# Patient Record
Sex: Female | Born: 1985 | Hispanic: Yes | Marital: Single | Smoking: Never smoker
Health system: Southern US, Community
[De-identification: ages and names within clinical notes are randomized; demographics above are authoritative.]

## PROBLEM LIST (undated history)

## (undated) DIAGNOSIS — Z789 Other specified health status: Secondary | ICD-10-CM

## (undated) HISTORY — PX: NO PAST SURGERIES: SHX2092

---

## 2016-06-18 NOTE — L&D Delivery Note (Signed)
Patient is 31 y.o. G1P0 4266w2d admitted for SROM/SOL. Low-risk pregnancy.  Delivery Note Upon arrival patient was complete and pushing. She pushed with good maternal effort to deliver a viable infant. Baby delivered without difficulty, was noted to have good tone and place on maternal abdomen for oral suctioning, drying and stimulation. Delayed cord clamping performed. At 6:41 PM a viable female was delivered via Vaginal, Spontaneous Delivery (Presentation: LOA).  APGAR: 9, 9; weight pending.   Placenta status: Intact .  Cord: 3v with the following complications: None.  Cord pH: N/A  Anesthesia:  None Episiotomy: None Lacerations:  Right Labial Suture Repair: 2.0 Monocryl Est. Blood Loss (mL):  250  Vaginal canal and perineum were inspected and noted to be hemostatic. Pitocin was started and uterus massaged until bleeding slowed.  Fundus firm on exam with massage.  Counts of sharps, instruments, and lap pads were all correct.  Mom to postpartum.  Baby to Couplet care / Skin to Skin.  Caryl AdaJazma Phelps, DO OB Fellow Faculty Practice, [facility] [place] 01/13/2017, 7:17 PM

## 2016-06-29 ENCOUNTER — Inpatient Hospital Stay (HOSPITAL_COMMUNITY)
Admission: AD | Admit: 2016-06-29 | Discharge: 2016-06-29 | Disposition: A | Discharge status: Home / Self Care | Payer: Medicaid Other | Source: Ambulatory Visit | Attending: Obstetrics and Gynecology | Admitting: Obstetrics and Gynecology

## 2016-06-29 ENCOUNTER — Encounter (HOSPITAL_COMMUNITY): Payer: Self-pay | Admitting: *Deleted

## 2016-06-29 DIAGNOSIS — R109 Unspecified abdominal pain: Secondary | ICD-10-CM

## 2016-06-29 DIAGNOSIS — O26891 Other specified pregnancy related conditions, first trimester: Secondary | ICD-10-CM | POA: Insufficient documentation

## 2016-06-29 DIAGNOSIS — Z3A11 11 weeks gestation of pregnancy: Secondary | ICD-10-CM | POA: Diagnosis not present

## 2016-06-29 HISTORY — DX: Other specified health status: Z78.9

## 2016-06-29 LAB — URINALYSIS, ROUTINE W REFLEX MICROSCOPIC
Bilirubin Urine: NEGATIVE
Glucose, UA: NEGATIVE mg/dL
Hgb urine dipstick: NEGATIVE
Ketones, ur: NEGATIVE mg/dL
Nitrite: NEGATIVE
PROTEIN: NEGATIVE mg/dL
Specific Gravity, Urine: 1.005 (ref 1.005–1.030)
pH: 6 (ref 5.0–8.0)

## 2016-06-29 LAB — POCT PREGNANCY, URINE: PREG TEST UR: POSITIVE — AB

## 2016-06-29 NOTE — MAU Note (Signed)
C/o  Lower, intermittent, abdominal pain  That wraps around to her lower back since yesterday around 1800; denies any vaginal bleeding or vaginal discharge;

## 2016-06-29 NOTE — Discharge Instructions (Signed)
Dolor abdominal durante el embarazo  (Abdominal Pain During Pregnancy)  El dolor de vientre (abdominal) es habitual durante el embarazo. Generalmente no se trata de un problema grave. Otras veces puede ser un signo de que algo no anda bien. Siempre comuníquese con su médico si tiene dolor abdominal.  CUIDADOS EN EL HOGAR  Controle el dolor para ver si hay cambios. Las indicaciones que siguen pueden ayudarla a sentirse mejor:  · Notenga sexo (relaciones sexuales) ni se coloque nada dentro de la vagina hasta que se sienta mejor.  · Haga reposo hasta que el dolor se calme.  · Si siente ganas de vomitar (náuseas ) beba líquidos claros. No consuma alimentos sólidos hasta que se sienta mejor.  · Sólo tome los medicamentos que le haya indicado su médico.  · Cumpla con las visitas al médico según las indicaciones.  SOLICITE AYUDA DE INMEDIATO SI:  · Tiene un sangrado, pierde líquido o elimina trozos de tejido por la vagina.  · Siente más dolor o cólicos.  · Comienza a vomitar.  · Siente dolor al orinar u observa sangre en la orina.  · Tiene fiebre.  · No siente que el bebé se mueva mucho.  · Se siente muy débil o cree que va a desmayarse.  · Tiene dificultad para respirar con o sin dolor en el vientre.  · Siente un dolor de cabeza muy intenso y dolor en el vientre.  · Observa que sale un líquido por la vagina y tiene dolor abdominal.  · La materia fecal es líquida (diarrea).  · El dolor en el viente no desaparece, o empeora, luego de hacer reposo.    ASEGÚRESE DE QUE:  · Comprende estas instrucciones.  · Controlará su afección.  · Recibirá ayuda de inmediato si no mejora o si empeora.    Esta información no tiene como fin reemplazar el consejo del médico. Asegúrese de hacerle al médico cualquier pregunta que tenga.  Document Released: 02/14/2011 Document Revised: 09/26/2015 Document Reviewed: 01/01/2013  Elsevier Interactive Patient Education © 2017 Elsevier Inc.

## 2016-06-29 NOTE — MAU Provider Note (Signed)
History     CSN: [id number]  Arrival date and time: 06/29/16 1128   First Provider Initiated Contact with Patient 06/29/16 1200      Chief Complaint  Patient presents with  . Abdominal Pain   HPI Ms. Alyssa Oconnell is a 31 y.o. G1P0 at [redacted]w[redacted]d who presents to MAU today with complaint of abdominal pain since yesterday. She rates her pain at 3/10 now. She has not taken anything for pain. She denies bleeding, discharge, or UTI symptoms today.   OB History    Gravida Para Term Preterm AB Living   1             SAB TAB Ectopic Multiple Live Births                  Past Medical History:  Diagnosis Date  . Medical history non-contributory     Past Surgical History:  Procedure Laterality Date  . NO PAST SURGERIES      No family history on file.  Social History  Substance Use Topics  . Smoking status: Never Smoker  . Smokeless tobacco: Never Used  . Alcohol use No    Allergies: Allergies not on file  No prescriptions prior to admission.    Review of Systems  Constitutional: Negative for fever.  Gastrointestinal: Positive for abdominal pain. Negative for constipation, diarrhea, nausea and vomiting.  Genitourinary: Negative for dysuria, frequency, urgency, vaginal bleeding and vaginal discharge.   Physical Exam   Blood pressure 113/81, pulse 75, temperature 98 F (36.7 C), temperature source Oral, resp. rate 18.  Physical Exam  Nursing note and vitals reviewed. Constitutional: She is oriented to person, place, and time. She appears well-developed and well-nourished. No distress.  HENT:  Head: Normocephalic and atraumatic.  Cardiovascular: Normal rate.   Respiratory: Effort normal.  GI: Soft. She exhibits no distension and no mass. There is no tenderness. There is no rebound and no guarding.  Neurological: She is alert and oriented to person, place, and time.  Skin: Skin is warm and dry. No erythema.  Psychiatric: She has a normal mood and affect.   Dilation: Closed Effacement (%): Thick Station:  (high) Exam by:: Harlon Flor PA  Results for orders placed or performed during the hospital encounter of 06/29/16 (from the past 24 hour(s))  Urinalysis, Routine w reflex microscopic     Status: Abnormal   Collection Time: 06/29/16 11:45 AM  Result Value Ref Range   Color, Urine YELLOW YELLOW   APPearance CLEAR CLEAR   Specific Gravity, Urine 1.005 1.005 - 1.030   pH 6.0 5.0 - 8.0   Glucose, UA NEGATIVE NEGATIVE mg/dL   Hgb urine dipstick NEGATIVE NEGATIVE   Bilirubin Urine NEGATIVE NEGATIVE   Ketones, ur NEGATIVE NEGATIVE mg/dL   Protein, ur NEGATIVE NEGATIVE mg/dL   Nitrite NEGATIVE NEGATIVE   Leukocytes, UA TRACE (A) NEGATIVE   RBC / HPF 0-5 0 - 5 RBC/hpf   WBC, UA 0-5 0 - 5 WBC/hpf   Bacteria, UA MANY (A) NONE SEEN   Squamous Epithelial / LPF 0-5 (A) NONE SEEN   Mucous PRESENT   Pregnancy, urine POC     Status: Abnormal   Collection Time: 06/29/16 11:52 AM  Result Value Ref Range   Preg Test, Ur POSITIVE (A) NEGATIVE    MAU Course  Procedures None   MDM UPT - negative FHR - 151 bpm with doppler  Cervix is closed UA today is normal   Assessment and Plan  A: SIUP  at 5832w0d Abdominal pain in pregnancy, first trimester  P: Discharge home Tylenol PRN for pain First precautions discussed Patient advised to follow-up with [facility] Patient may return to MAU as needed or if her condition were to change or worsen   Marny LowensteinJulie N Tamra Koos, PA-C  06/29/2016, 12:01 PM

## 2016-06-30 LAB — CULTURE, OB URINE

## 2016-07-03 ENCOUNTER — Encounter (HOSPITAL_COMMUNITY): Payer: Self-pay | Admitting: Nurse Practitioner

## 2016-07-03 ENCOUNTER — Other Ambulatory Visit (HOSPITAL_COMMUNITY): Payer: Self-pay | Admitting: Nurse Practitioner

## 2016-07-03 DIAGNOSIS — Z3682 Encounter for antenatal screening for nuchal translucency: Secondary | ICD-10-CM

## 2016-07-03 DIAGNOSIS — Z3491 Encounter for supervision of normal pregnancy, unspecified, first trimester: Secondary | ICD-10-CM

## 2016-07-03 LAB — OB RESULTS CONSOLE ABO/RH: RH TYPE: POSITIVE

## 2016-07-03 LAB — OB RESULTS CONSOLE GC/CHLAMYDIA
Chlamydia: NEGATIVE
GC PROBE AMP, GENITAL: NEGATIVE

## 2016-07-03 LAB — OB RESULTS CONSOLE HIV ANTIBODY (ROUTINE TESTING): HIV: NONREACTIVE

## 2016-07-03 LAB — OB RESULTS CONSOLE HEPATITIS B SURFACE ANTIGEN: Hepatitis B Surface Ag: NEGATIVE

## 2016-07-03 LAB — OB RESULTS CONSOLE RPR: RPR: NONREACTIVE

## 2016-07-03 LAB — OB RESULTS CONSOLE RUBELLA ANTIBODY, IGM: Rubella: IMMUNE

## 2016-07-11 ENCOUNTER — Encounter (HOSPITAL_COMMUNITY): Payer: Self-pay

## 2016-07-11 ENCOUNTER — Other Ambulatory Visit (HOSPITAL_COMMUNITY): Payer: Self-pay | Admitting: Nurse Practitioner

## 2016-07-11 ENCOUNTER — Ambulatory Visit (HOSPITAL_COMMUNITY)
Admission: RE | Admit: 2016-07-11 | Discharge: 2016-07-11 | Disposition: A | Discharge status: Home / Self Care | Payer: Medicaid Other | Source: Ambulatory Visit | Attending: Obstetrics and Gynecology | Admitting: Obstetrics and Gynecology

## 2016-07-11 ENCOUNTER — Ambulatory Visit (HOSPITAL_COMMUNITY)
Admission: RE | Admit: 2016-07-11 | Discharge: 2016-07-11 | Disposition: A | Discharge status: Home / Self Care | Payer: Medicaid Other | Source: Ambulatory Visit | Attending: Nurse Practitioner | Admitting: Nurse Practitioner

## 2016-07-11 DIAGNOSIS — Z3491 Encounter for supervision of normal pregnancy, unspecified, first trimester: Secondary | ICD-10-CM

## 2016-07-11 DIAGNOSIS — Z3682 Encounter for antenatal screening for nuchal translucency: Secondary | ICD-10-CM

## 2016-07-11 DIAGNOSIS — Z3A12 12 weeks gestation of pregnancy: Secondary | ICD-10-CM | POA: Diagnosis not present

## 2016-07-11 DIAGNOSIS — O99211 Obesity complicating pregnancy, first trimester: Secondary | ICD-10-CM | POA: Insufficient documentation

## 2016-07-11 DIAGNOSIS — E669 Obesity, unspecified: Secondary | ICD-10-CM

## 2016-07-11 IMAGING — US US MFM FETAL NUCHAL TRANSLUCENCY
1 series · 15 of 28 positions shown · non-contrast
Comparison: none

[Series 1: us mfm fetal nuchal translucency · 43 acquisitions, 15 frames shown]
[im 1/43]
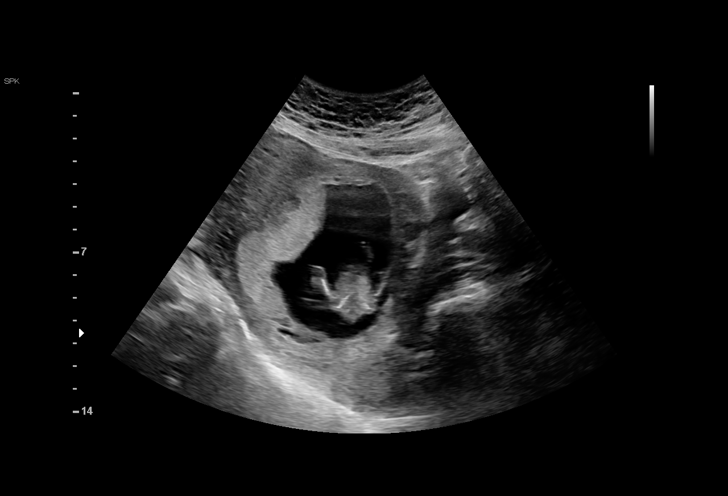
[im 4/43]
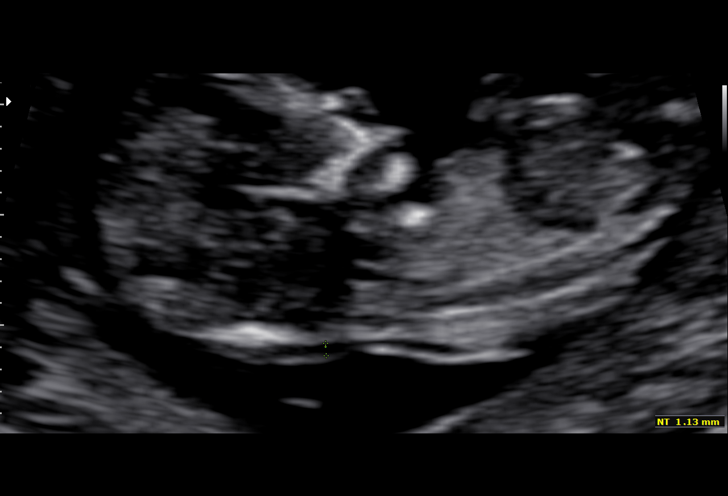
[im 7/43]
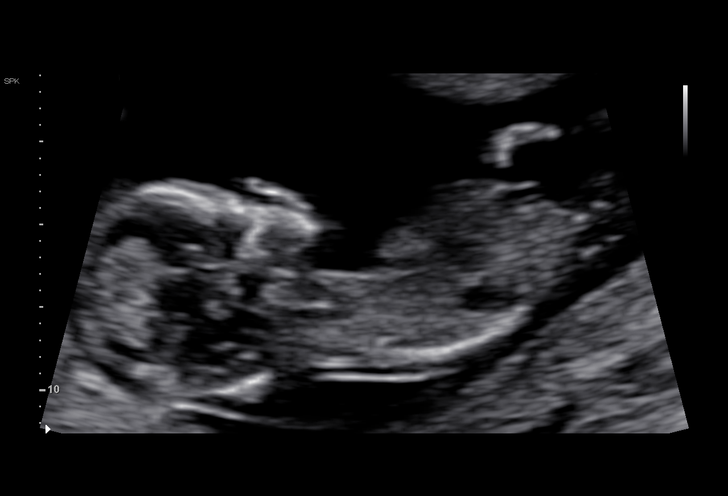
[im 10/43]
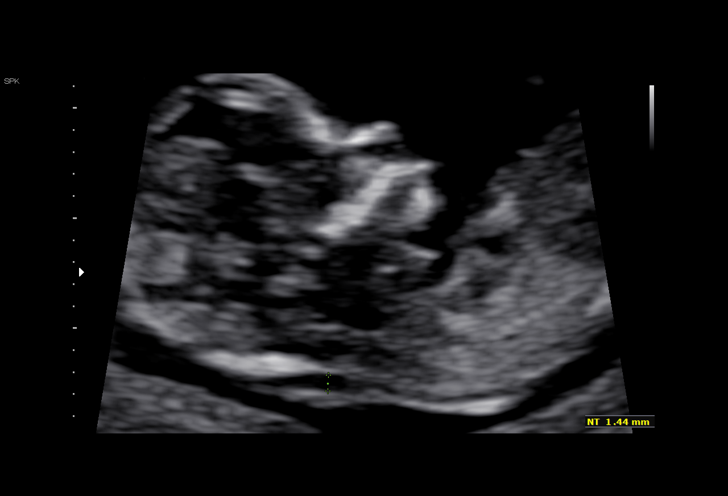
[im 13/43]
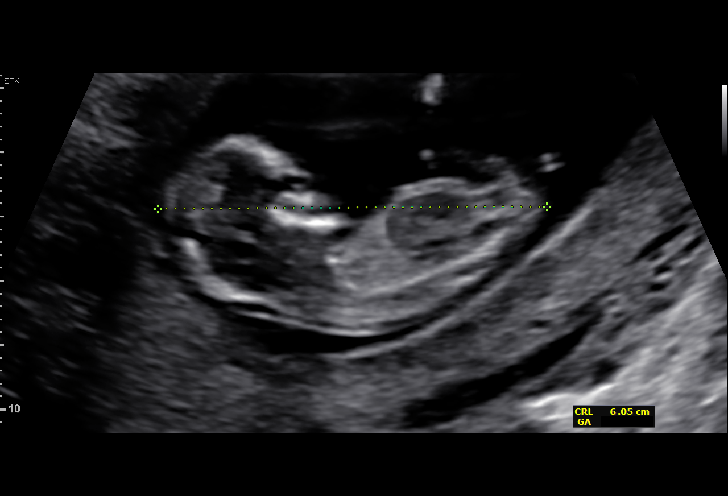
[im 16/43]
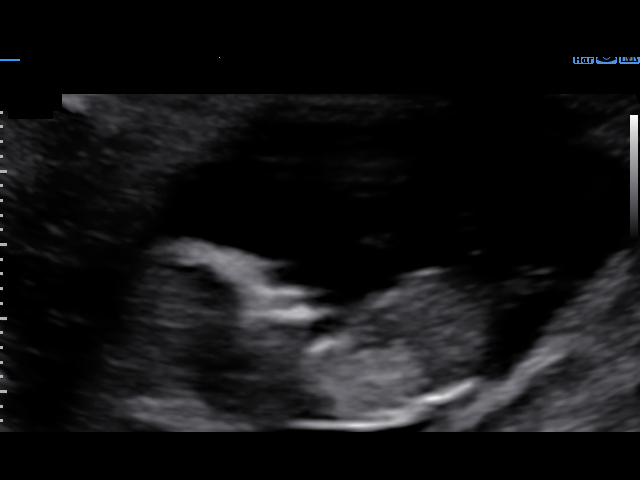
[im 19/43]
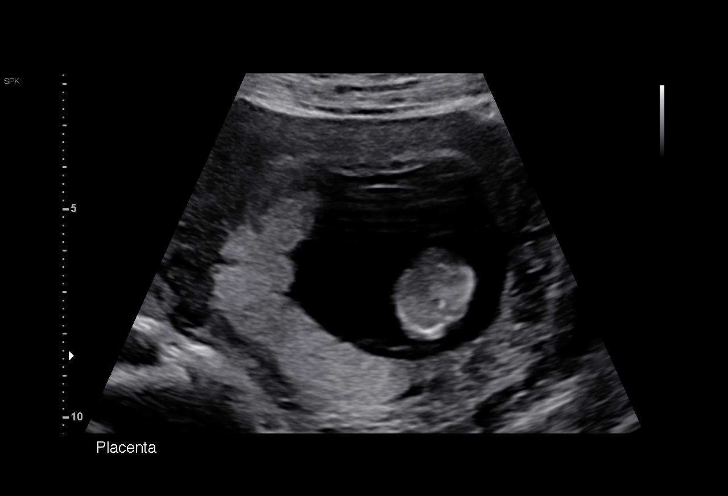
[im 22/43]
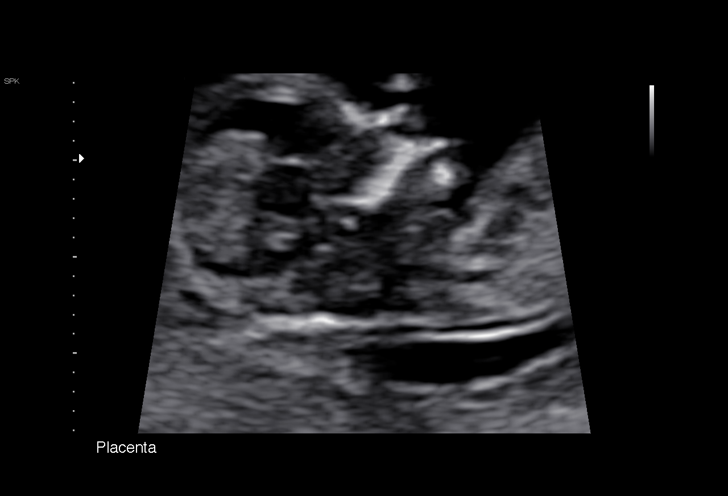
[im 24/43]
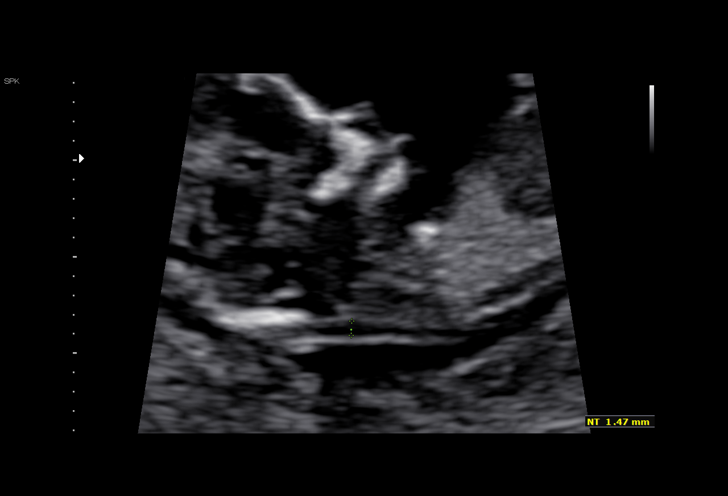
[im 27/43]
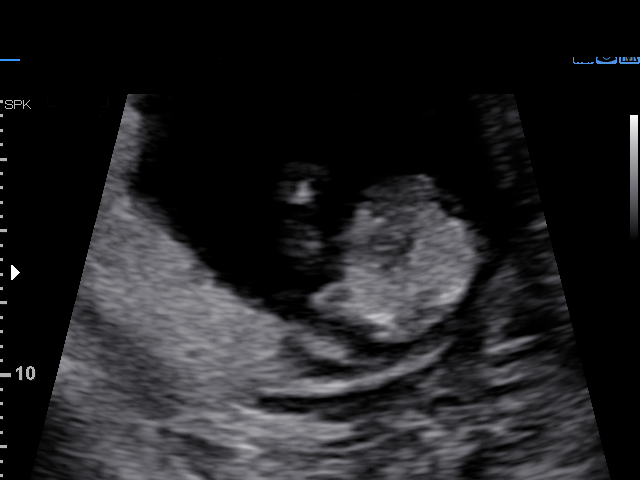
[im 30/43]
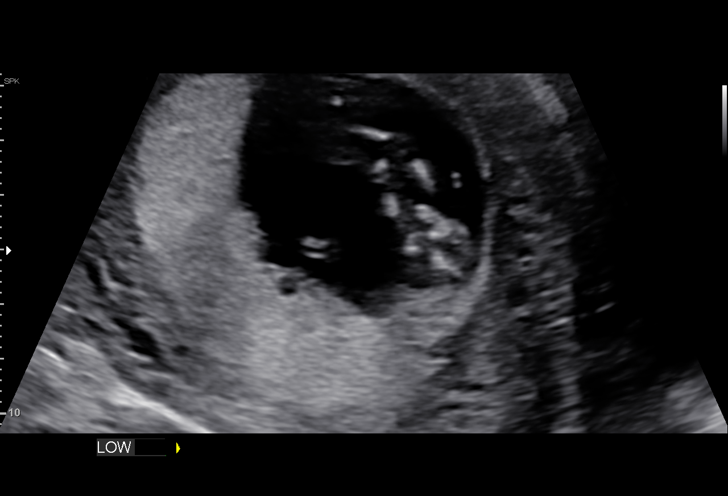
[im 33/43]
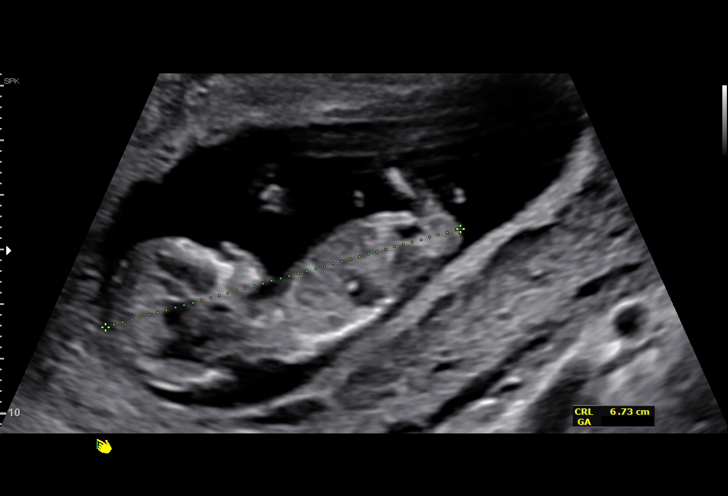
[im 36/43]
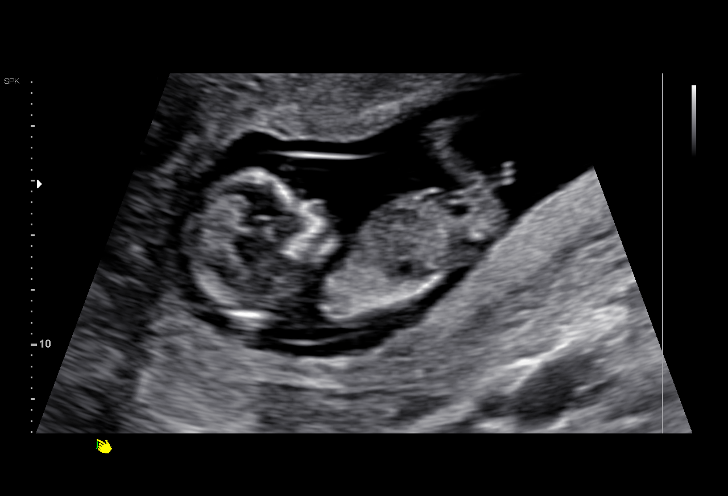
[im 39/43]
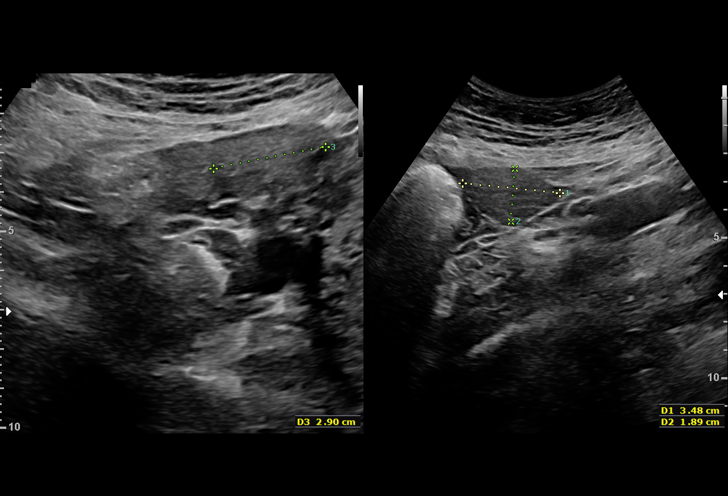
[im 43/43]
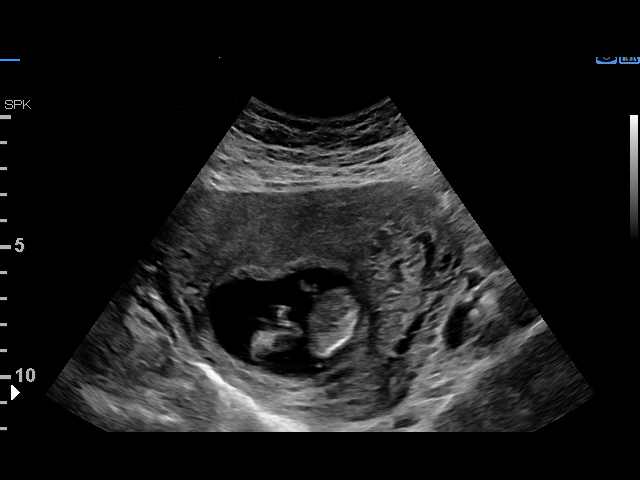

[15 of 28 positions shown; findings below may reference images not displayed]

[REDACTED]
NURULLA

TRANSLUCENCY

1  NURULLA                [PHONE_NUMBER]      [PHONE_NUMBER]     [PHONE_NUMBER]
NURULLA
Indications

12 weeks gestation of pregnancy
Obesity complicating pregnancy, first          [31]
trimester
Encounter for nuchal translucency              [31]
OB History

Blood Type:            Height:  5'6"   Weight (lb):  188      BMI:
Gravidity:    1         Term:   0        Prem:   0        SAB:   0
TOP:          0       Ectopic:  0        Living: 0
Fetal Evaluation

Num Of Fetuses:     1
Fetal Heart         144
Rate(bpm):
Cardiac Activity:   Observed
Placenta:           Posterior, above cervical os

Amniotic Fluid
AFI FV:      Subjectively within normal limits
Gestational Age

LMP:           12w 5d       Date:   [DATE]                 EDD:   [DATE]
Best:          12w 5d    Det. By:   LMP  ([DATE])          EDD:   [DATE]
1st Trimester Genetic Sonogram Screening

CRL:            67.3  mm    G. Age:   12w 6d                 EDD:   [DATE]
Nuc Trans:       1.4  mm
Nasal Bone:                 Present
Anatomy

Cranium:               Appears normal         Bladder:                Visualized
Choroid Plexus:        Visualized             Upper Extremities:      Visualized
Stomach:               Visualized             Lower Extremities:      Visualized
Abdominal Wall:        Visualized
Cervix Uterus Adnexa

Uterus
No abnormality visualized.

Left Ovary
Not visualized.

Right Ovary
Within normal limits.

Adnexa:       No abnormality visualized.
Impression

SIUP at 12+5 weeks
No gross abnormalities identified
NT measurement was within normal limits for this GA; NB
present
Normal amniotic fluid volume
Measurements consistent with LMP dating
Recommendations

Offer MSAFP in the second trimester for ONTD screening
Offer anatomy U/S by 18 weeks

## 2016-07-16 ENCOUNTER — Other Ambulatory Visit: Payer: Self-pay

## 2016-10-29 LAB — OB RESULTS CONSOLE RPR: RPR: NONREACTIVE

## 2016-10-29 LAB — OB RESULTS CONSOLE HIV ANTIBODY (ROUTINE TESTING): HIV: NONREACTIVE

## 2016-12-18 LAB — OB RESULTS CONSOLE GC/CHLAMYDIA
CHLAMYDIA, DNA PROBE: NEGATIVE
Gonorrhea: NEGATIVE

## 2016-12-18 LAB — OB RESULTS CONSOLE GBS: GBS: POSITIVE

## 2017-01-13 ENCOUNTER — Inpatient Hospital Stay (HOSPITAL_COMMUNITY)
Admission: AD | Admit: 2017-01-13 | Discharge: 2017-01-15 | DRG: 775 | Disposition: A | Discharge status: Home / Self Care | Payer: Medicaid Other | Source: Ambulatory Visit | Attending: Obstetrics and Gynecology | Admitting: Obstetrics and Gynecology

## 2017-01-13 ENCOUNTER — Encounter (HOSPITAL_COMMUNITY): Payer: Self-pay

## 2017-01-13 DIAGNOSIS — Z3A39 39 weeks gestation of pregnancy: Secondary | ICD-10-CM

## 2017-01-13 DIAGNOSIS — O26893 Other specified pregnancy related conditions, third trimester: Secondary | ICD-10-CM | POA: Diagnosis present

## 2017-01-13 DIAGNOSIS — O99824 Streptococcus B carrier state complicating childbirth: Principal | ICD-10-CM | POA: Diagnosis present

## 2017-01-13 LAB — CBC
HEMATOCRIT: 38.9 % (ref 36.0–46.0)
HEMOGLOBIN: 13.2 g/dL (ref 12.0–15.0)
MCH: 29.9 pg (ref 26.0–34.0)
MCHC: 33.9 g/dL (ref 30.0–36.0)
MCV: 88.2 fL (ref 78.0–100.0)
Platelets: 220 10*3/uL (ref 150–400)
RBC: 4.41 MIL/uL (ref 3.87–5.11)
RDW: 14.2 % (ref 11.5–15.5)
WBC: 9.7 10*3/uL (ref 4.0–10.5)

## 2017-01-13 LAB — ABO/RH: ABO/RH(D): O POS

## 2017-01-13 LAB — TYPE AND SCREEN
ABO/RH(D): O POS
ANTIBODY SCREEN: NEGATIVE

## 2017-01-13 LAB — POCT FERN TEST: POCT Fern Test: POSITIVE

## 2017-01-13 LAB — RPR: RPR Ser Ql: NONREACTIVE

## 2017-01-13 MED ORDER — FENTANYL CITRATE (PF) 100 MCG/2ML IJ SOLN
50.0000 ug | INTRAMUSCULAR | Status: DC | PRN
  Administered 2017-01-13 (×3): 100 ug via INTRAVENOUS
  Filled 2017-01-13 (×3): qty 2

## 2017-01-13 MED ORDER — DIBUCAINE 1 % RE OINT: 1.0000 "application " | TOPICAL_OINTMENT | RECTAL | Status: DC | PRN

## 2017-01-13 MED ORDER — ONDANSETRON HCL 4 MG/2ML IJ SOLN: 4.0000 mg | INTRAMUSCULAR | Status: DC | PRN

## 2017-01-13 MED ORDER — BENZOCAINE-MENTHOL 20-0.5 % EX AERO
1.0000 "application " | INHALATION_SPRAY | CUTANEOUS | Status: DC | PRN
  Filled 2017-01-13: qty 56

## 2017-01-13 MED ORDER — OXYTOCIN 40 UNITS IN LACTATED RINGERS INFUSION - SIMPLE MED: 2.5000 [IU]/h | INTRAVENOUS | Status: DC

## 2017-01-13 MED ORDER — LACTATED RINGERS IV SOLN: 500.0000 mL | INTRAVENOUS | Status: DC | PRN

## 2017-01-13 MED ORDER — ACETAMINOPHEN 325 MG PO TABS: 650.0000 mg | ORAL_TABLET | ORAL | Status: DC | PRN

## 2017-01-13 MED ORDER — SENNOSIDES-DOCUSATE SODIUM 8.6-50 MG PO TABS
2.0000 | ORAL_TABLET | ORAL | Status: DC
  Administered 2017-01-15: 2 via ORAL
  Filled 2017-01-13: qty 2

## 2017-01-13 MED ORDER — PRENATAL MULTIVITAMIN CH
1.0000 | ORAL_TABLET | Freq: Every day | ORAL | Status: DC
  Administered 2017-01-14 – 2017-01-15 (×2): 1 via ORAL
  Filled 2017-01-13 (×2): qty 1

## 2017-01-13 MED ORDER — ONDANSETRON HCL 4 MG/2ML IJ SOLN: 4.0000 mg | Freq: Four times a day (QID) | INTRAMUSCULAR | Status: DC | PRN

## 2017-01-13 MED ORDER — DIPHENHYDRAMINE HCL 25 MG PO CAPS: 25.0000 mg | ORAL_CAPSULE | Freq: Four times a day (QID) | ORAL | Status: DC | PRN

## 2017-01-13 MED ORDER — OXYCODONE HCL 5 MG PO TABS
5.0000 mg | ORAL_TABLET | ORAL | Status: DC | PRN
  Administered 2017-01-14: 5 mg via ORAL
  Filled 2017-01-13: qty 1

## 2017-01-13 MED ORDER — OXYTOCIN BOLUS FROM INFUSION
500.0000 mL | Freq: Once | INTRAVENOUS | Status: AC
  Administered 2017-01-13: 500 mL via INTRAVENOUS

## 2017-01-13 MED ORDER — IBUPROFEN 600 MG PO TABS
600.0000 mg | ORAL_TABLET | Freq: Four times a day (QID) | ORAL | Status: DC
  Administered 2017-01-13 – 2017-01-15 (×8): 600 mg via ORAL
  Filled 2017-01-13 (×8): qty 1

## 2017-01-13 MED ORDER — PENICILLIN G POT IN DEXTROSE 60000 UNIT/ML IV SOLN
3.0000 10*6.[IU] | INTRAVENOUS | Status: DC
  Administered 2017-01-13 (×2): 3 10*6.[IU] via INTRAVENOUS
  Filled 2017-01-13 (×4): qty 50

## 2017-01-13 MED ORDER — LACTATED RINGERS IV SOLN
INTRAVENOUS | Status: DC
  Administered 2017-01-13: 15:00:00 via INTRAVENOUS

## 2017-01-13 MED ORDER — OXYTOCIN 40 UNITS IN LACTATED RINGERS INFUSION - SIMPLE MED
1.0000 m[IU]/min | INTRAVENOUS | Status: DC
  Administered 2017-01-13: 2 m[IU]/min via INTRAVENOUS
  Filled 2017-01-13: qty 1000

## 2017-01-13 MED ORDER — SOD CITRATE-CITRIC ACID 500-334 MG/5ML PO SOLN: 30.0000 mL | ORAL | Status: DC | PRN

## 2017-01-13 MED ORDER — OXYCODONE HCL 5 MG PO TABS: 10.0000 mg | ORAL_TABLET | ORAL | Status: DC | PRN

## 2017-01-13 MED ORDER — PENICILLIN G POTASSIUM 5000000 UNITS IJ SOLR
5.0000 10*6.[IU] | Freq: Once | INTRAVENOUS | Status: AC
  Administered 2017-01-13: 5 10*6.[IU] via INTRAVENOUS
  Filled 2017-01-13: qty 5

## 2017-01-13 MED ORDER — TERBUTALINE SULFATE 1 MG/ML IJ SOLN: 0.2500 mg | Freq: Once | INTRAMUSCULAR | Status: DC | PRN

## 2017-01-13 MED ORDER — COCONUT OIL OIL: 1.0000 "application " | TOPICAL_OIL | Status: DC | PRN

## 2017-01-13 MED ORDER — ZOLPIDEM TARTRATE 5 MG PO TABS: 5.0000 mg | ORAL_TABLET | Freq: Every evening | ORAL | Status: DC | PRN

## 2017-01-13 MED ORDER — SIMETHICONE 80 MG PO CHEW: 80.0000 mg | CHEWABLE_TABLET | ORAL | Status: DC | PRN

## 2017-01-13 MED ORDER — ONDANSETRON HCL 4 MG PO TABS: 4.0000 mg | ORAL_TABLET | ORAL | Status: DC | PRN

## 2017-01-13 MED ORDER — TETANUS-DIPHTH-ACELL PERTUSSIS 5-2.5-18.5 LF-MCG/0.5 IM SUSP: 0.5000 mL | Freq: Once | INTRAMUSCULAR | Status: DC

## 2017-01-13 MED ORDER — LIDOCAINE HCL (PF) 1 % IJ SOLN
30.0000 mL | INTRAMUSCULAR | Status: AC | PRN
  Administered 2017-01-13: 30 mL via SUBCUTANEOUS
  Filled 2017-01-13 (×2): qty 30

## 2017-01-13 MED ORDER — WITCH HAZEL-GLYCERIN EX PADS: 1.0000 "application " | MEDICATED_PAD | CUTANEOUS | Status: DC | PRN

## 2017-01-13 NOTE — Anesthesia Pain Management Evaluation Note (Signed)
  CRNA Pain Management Visit Note  Patient: Alyssa Oconnell, 31 y.o., female  "Hello I am a [facility] of the [facility]. We have an anesthesia team available at all times to provide care throughout the hospital, including epidural management and anesthesia for C-section. I don't know your plan for the delivery whether it a natural birth, water birth, IV sedation, nitrous supplementation, doula or epidural, but we want to meet your pain goals."   1.Was your pain managed to your expectations on prior hospitalizations?   No prior hospitalizations  2.What is your expectation for pain management during this hospitalization?     IV pain meds  3.How can we help you reach that goal? IV pain meds. Patient aware of all options for pain control. Spanish interpreter utilized for interview.  Record the patient's initial score and the patient's pain goal.   Pain: 3-6; patient states that this is tolerable for now. I informed patient to ask L&D RN for pain control when she is ready. L&D RN at bedside during interview.  Pain Goal: 7-8 [facility] wants you to be able to say your pain was always managed very well.  Ora Bollig L 01/13/2017

## 2017-01-13 NOTE — MAU Note (Signed)
Water broke at 3:10 am, clear fluid with pink tinge. Baby moving well.

## 2017-01-13 NOTE — Progress Notes (Signed)
Labor Progress Note  Alyssa LopeValeria Oconnell is a 31 y.o. G1P0 at 1286w2d  admitted for rupture of membranes  S: Doing well. Started IV pain medications. Feeling warm but no recorded temperatures.   O:  BP 125/79   Pulse 65   Temp 98.3 F (36.8 C) (Oral)   Resp 18   Ht 5' 5.5" (1.664 m)   Wt 214 lb 4 oz (97.2 kg)   LMP 04/13/2016   SpO2 100%   BMI 35.11 kg/m   No intake/output data recorded.  FHT:  FHR: 120 bpm, variability: moderate,  accelerations:  Present,  decelerations:  Absent UC:   regular, every 1-2 minutes SVE:   Dilation: 8.5 Effacement (%): 90 Station: +1 Exam by:: Dr. Doroteo GlassmanPhelps  Pitocin @ 8 mu/min  Labs: Lab Results  Component Value Date   WBC 9.7 01/13/2017   HGB 13.2 01/13/2017   HCT 38.9 01/13/2017   MCV 88.2 01/13/2017   PLT 220 01/13/2017    Assessment / Plan: 31 y.o. G1P0 4886w2d in active labor Augmentation of labor, progressing well  Labor: Progressing normally on Pitocin Fetal Wellbeing:  Category I Pain Control:  IV pain meds Anticipated MOD:  NSVD  Expectant management   Alyssa AdaJazma Phelps, DO OB Fellow Faculty Practice, [facility] -  01/13/2017, 4:22 PM

## 2017-01-13 NOTE — Progress Notes (Signed)
Husband and family instructed to not push with uc's

## 2017-01-13 NOTE — Progress Notes (Signed)
5 big sponges 5 small sponges 2 injectables 10 instruments MO

## 2017-01-13 NOTE — H&P (Signed)
LABOR AND DELIVERY ADMISSION HISTORY AND PHYSICAL NOTE  Alyssa Oconnell is a 31 y.o. female G1P0 with IUP at 992w2d by LMP/12 wk sono presenting for SROM@0310 . Reports some contractions since then.   She reports positive fetal movement. She denies vaginal bleeding. Patient denies headache, vision changes, chest pain, shortness of breath, or RUQ pain.   [facility]/Complications[facility] No complications.   Past Medical History: Past Medical History:  Diagnosis Date  . Medical history non-contributory     Past Surgical History: Past Surgical History:  Procedure Laterality Date  . NO PAST SURGERIES      Obstetrical History: OB History    Gravida Para Term Preterm AB Living   1             SAB TAB Ectopic Multiple Live Births                  Social History: Social History   Social History  . Marital status: Single    Spouse name: N/A  . Number of children: N/A  . Years of education: N/A   Social History Main Topics  . Smoking status: Never Smoker  . Smokeless tobacco: Never Used  . Alcohol use No  . Drug use: No  . Sexual activity: Not Asked   Other Topics Concern  . None   Social History Narrative  . None    Family History: Family History  Problem Relation Age of Onset  . Hypertension Father     Allergies: No Known Allergies  Prescriptions Prior to Admission  Medication Sig Dispense Refill Last Dose  . Prenatal Vit-Fe Fumarate-FA (PRENATAL MULTIVITAMIN) TABS tablet Take 1 tablet by mouth daily at 12 noon.   01/12/2017 at Unknown time     Review of Systems   All systems reviewed and negative except as stated in HPI  Blood pressure 124/78, pulse 83, temperature 98.8 F (37.1 C), temperature source Oral, resp. rate 18, height 5' 5.5" (1.664 m), weight 97.2 kg (214 lb 4 oz), last menstrual period 04/13/2016, SpO2 100 %. General appearance: alert, cooperative, appears stated age and no distress Lungs: clear to auscultation  bilaterally Heart: regular rate and rhythm Abdomen: soft, non-tender; bowel sounds normal Extremities: No calf swelling or tenderness Presentation: cephalic Fetal monitoring: 130/mod/+ac/-dc Uterine activity: every 6-8 minutes Dilation: 3 Effacement (%): 60 Station: -1 Exam by:: Dr Nira Retortegele MD  Prenatal labs: ABO, Rh:  O positive Antibody:  negative Rubella:  Immune RPR:   Nonreactive  HBsAg:   Negative HIV:   Nonreactive GBS:   Positive 1 hr Glucola: 118 Genetic screening:  Normal Anatomy US: Normal  Prenatal Transfer Tool  Maternal Diabetes: No Genetic Screening: Normal Maternal Ultrasounds/Referrals: Normal Fetal Ultrasounds or other Referrals:  None Maternal Substance Abuse:  No Significant Maternal Medications:  None Significant Maternal Lab Results: Lab values include: Group B Strep positive  Results for orders placed or performed during the hospital encounter of 01/13/17 (from the past 24 hour(s))  The [employer]ern Test   Collection Time: 01/13/17  5:38 AM  Result Value Ref Range   POCT Fern Test Positive = ruptured amniotic membanes     There are no active problems to display for this patient.   Assessment: Alyssa Oconnell is a 31 y.o. G1P0 at 6892w2d here for SROM@0310 .  #Labor: Expectant management for now. Start augmentation if not in active labor.  #Pain: IV pain meds PRN, epidural when desired in active labor #FWB: Cat 1 #GBS:  Positive #MOF: Breast #MOC:  Nexplanon #Circ: N/A  Conard NovakJoshua A Christian, MD 01/13/2017, 5:52 AM  OB FELLOW HISTORY AND PHYSICAL ATTESTATION  I have seen and examined this patient; I agree with above documentation in the resident's note.    Frederik PearJulie P Kerin Kren, MD OB Fellow 01/13/2017, 6:44 AM

## 2017-01-13 NOTE — Progress Notes (Signed)
Labor Progress Note  Alyssa Oconnell is a 31 y.o. G1P0 at 9480w2d  admitted for rupture of membranes  S: Doing well. Feeling the contractions more. Feeling more pressure  O:  BP 107/69   Pulse 78   Temp 98.2 F (36.8 C) (Oral)   Resp 18   Ht 5' 5.5" (1.664 m)   Wt 214 lb 4 oz (97.2 kg)   LMP 04/13/2016   SpO2 100%   BMI 35.11 kg/m   No intake/output data recorded.  FHT:  FHR: 135 bpm, variability: moderate,  accelerations:  Present,  decelerations:  Absent UC:   regular, every 2-4 minutes SVE:   Dilation: 5 Effacement (%): 70 Station: +1 Exam by:: Dr. Doroteo GlassmanPhelps  Pitocin @ 4 mu/min  Labs: Lab Results  Component Value Date   WBC 9.7 01/13/2017   HGB 13.2 01/13/2017   HCT 38.9 01/13/2017   MCV 88.2 01/13/2017   PLT 220 01/13/2017    Assessment / Plan: 31 y.o. G1P0 5380w2d in active labor Augmentation of labor, progressing well  Labor: Progressing on Pitocin Fetal Wellbeing:  Category I Pain Control:  Labor support without medications and IV pain meds Anticipated MOD:  NSVD  Expectant management   Alyssa AdaJazma Doc Mandala, DO OB Fellow Faculty Practice, [facility] [place] 01/13/2017, 1:21 PM

## 2017-01-14 DIAGNOSIS — Z3A39 39 weeks gestation of pregnancy: Secondary | ICD-10-CM

## 2017-01-14 DIAGNOSIS — O99824 Streptococcus B carrier state complicating childbirth: Secondary | ICD-10-CM

## 2017-01-14 NOTE — Progress Notes (Signed)
UR chart review completed.  

## 2017-01-14 NOTE — Discharge Instructions (Signed)
Instrucciones para la mamá sobre los cuidados en el hogar °(Home Care Instructions for Mom) °ACTIVIDAD °· Reanude sus actividades regulares de forma gradual. °· Descanse. Tome siestas cuando el bebé duerme. °· No levante objetos que pesen más de 10 libras (4,5 kg) hasta que el médico se lo autorice. °· Evite las actividades que demandan mucho esfuerzo y energía (que son extenuantes) hasta que el médico se lo autorice. Caminar a un ritmo tranquilo a moderado siempre es más seguro. °· Si tuvo un parto por cesárea: °? No pase la aspiradora, suba escaleras o conduzca un vehículo durante 4 o 6 semanas. °? Pídale a alguien que le brinde ayuda con las tareas domésticas hasta que pueda realizarlas por su cuenta. °? Haga ejercicios como se lo haya indicado el médico, si corresponde. ° °HEMORRAGIA VAGINAL °Probablemente continúe sangrando durante 4 o 6 semanas después del parto. Generalmente, la cantidad de sangre disminuye y el color se hace más claro con el transcurso del tiempo. Sin embargo, si usted está demasiado activa, el color de la sangre puede ser rojo brillante. Si necesita cambiarse la compresa higiénica en menos de una hora o tiene coágulos grandes: °· Permanezca acostada. °· Eleve los pies. °· Coloque compresas frías en la zona inferior del abdomen. °· Haga reposo. °· Comuníquese con su médico. °Si está amamantando, podría volver a tener su período entre las 8 semanas después del parto y el momento en que deje de amamantar. Si no está amamantando, volverá a tener su período 6 u 8 semanas después del parto. °CUIDADOS PERINEALES °La zona perineal o perineo, es la parte del cuerpo que se encuentra entre los muslos. Después del parto, esta zona necesita un cuidado especial. Siga las siguientes indicaciones como se lo haya indicado su médico. °· Tome baños de inmersión durante 15 o 20 minutos. °· Utilice apósitos o aerosoles analgésicos y cremas como se lo hayan indicado. °· No utilice tampones ni se haga duchas  vaginales hasta que el sangrado vaginal se haya detenido. °· Cada vez que vaya al baño: °? Use una botella perineal. °? Cámbiese el apósito. °? Use papel tisú en lugar de papel higiénico hasta que se cure la sutura. °· Haga ejercicios de Kegel todos los días. Los ejercicios Kegel ayudan a mantener los músculos que sostienen la vagina, la vejiga y los intestinos. Estos ejercicios se pueden realizar mientras está parada, sentada o acostada. Para hacer los ejercicios de Kegel: °? Tense los músculos del estómago y los que rodean el canal de parto. °? Mantenga esta posición durante unos segundos. °? Relájese. °? Repita hasta hacerlos 5 veces seguidas. °· Para evitar las hemorroides o que estas empeoren: °? Beba suficiente líquido para mantener la orina clara o de color amarillo pálido. °? Evite hacer fuerza al defecar. °? Tome los medicamentos y laxantes de venta libre como se lo haya indicado el médico. °CUIDADO DE LAS MAMAS °· Use un buen sostén. °· Evite tomar analgésicos de venta libre para las molestias de los pechos. °· Aplique hielo en los pechos para aliviar las molestias tanto como sea necesario: °? Ponga el hielo en una bolsa plástica. °? Coloque una toalla entre la piel y la bolsa de hielo. °? Aplique el hielo durante 20, o como se lo haya indicado el médico. ° °NUTRICIÓN °· Mantenga una dieta bien balanceada. °· No intente perder de peso rápidamente reduciendo el consumo de calorías. °· Tome sus vitaminas prenatales hasta el control de postparto o hasta que su médico se lo indique. ° °DEPRESIÓN POSTPARTO °  Puede sentir deseos de llorar sin motivo aparente y verse incapaz de enfrentarse a todos los cambios que implica tener un bebé. Este estado de ánimo se llama depresión postparto. La depresión postparto ocurre porque sus niveles hormonales sufren cambios después del parto. Si usted tiene depresión postparto, busque contención por parte de su pareja, sus amigos y su familia. Si la depresión no desaparece por  sí sola después de algunas semanas, concurra a su médico. °AUTOEXAMEN DE MAMAS °Realícese autoexámenes en el mismo momento cada mes. Si está amamantando, el mejor momento de controlar sus mamas es después de alimentar al bebé, cuando los pechos no están tan llenos. Si está amamantando y su período ya comenzó, controle sus mamas el día 5, 6 o 7 de su período. °Informe a su médico de cualquier protuberancia, bulto o secreción. Si está amamantando, las mamas normalmente tienen bultos. Esto es transitorio y no es un riesgo para la salud. °INTIMIDAD Y SEXUALIDAD °Debe evitar las relaciones sexuales durante al menos 3 o 4 semanas después del parto o hasta que el flujo de color rojo amarronado haya desaparecido completamente. Si no desea quedar embarazada nuevamente, use algún método anticonceptivo. Después del parto, puede quedar embarazada incluso si no ha tenido todavía el período. °SOLICITE ATENCIÓN MÉDICA SI: °· Se siente incapaz de controlar los cambios que implica tener un hijo y esos sentimientos no desaparecen después de algunas semanas. °· Detecta una protuberancia, bulto o secreción en sus mamas. ° °SOLICITE ATENCIÓN MÉDICA DE INMEDIATO SI: °· Debe cambiarse la compresa higiénica en 1 hora o menos. °· Tiene los siguientes síntomas: °? Dolor intenso o calambres en la parte inferior del abdomen. °? Una secreción vaginal con mal olor. °? Fiebre que no se alivia con los medicamentos. °? Una zona de la mama se pone roja y le causa dolor, y además usted tiene fiebre. °? Una pantorrilla enrojecida y con dolor. °? Repentino e intenso dolor en el pecho. °? Falta de aire. °? Micción dolorosa o con sangre. °? Problemas visuales. °· Vómitos durante 12 horas o más. °· Dolor de cabeza intenso. °· Tiene pensamientos serios acerca de lastimarse a usted misma o dañar al niño o a otra persona. ° °Esta información no tiene como fin reemplazar el consejo del médico. Asegúrese de hacerle al médico cualquier pregunta que  tenga. °Document Released: 06/04/2005 Document Revised: 09/26/2015 Document Reviewed: 12/06/2014 °Elsevier Interactive Patient Education © 2017 Elsevier Inc. ° °

## 2017-01-14 NOTE — Progress Notes (Signed)
POSTPARTUM PROGRESS NOTE  Post Partum Day 1  Subjective:  Alyssa Oconnell is a 31 y.o. G1P1001 9350w2d s/p SVD.  No acute events overnight.  Pt denies problems with ambulating, voiding or po intake.  She denies nausea or vomiting.  Pain is well controlled.  Lochia Moderate.   Objective: Blood pressure 117/81, pulse 77, temperature 98.4 F (36.9 C), temperature source Oral, resp. rate 18, height 5' 5.5" (1.664 m), weight 214 lb 4 oz (97.2 kg), last menstrual period 04/13/2016, SpO2 97 %, unknown if currently breastfeeding.  Physical Exam:  General: alert, cooperative and no distress Chest: no respiratory distress Heart:regular rate, distal pulses intact Abdomen: soft, nontender,  Uterine Fundus: firm, appropriately tender DVT Evaluation: No calf swelling or tenderness Extremities: no edema   Recent Labs  01/13/17 0615  HGB 13.2  HCT 38.9    Assessment/Plan:  ASSESSMENT: Alyssa Oconnell is a 31 y.o. G1P1001 s/p SVD at  4450w2d.  Plan for discharge tomorrow   LOS: 1 day   Kandra NicolasJulie P DegeleMD 01/14/2017, 8:40 AM

## 2017-01-14 NOTE — Progress Notes (Signed)
In-house interpreter, Bonnye FavaViria, used to re-introduce RN, explain plan of care for Mom and baby, assess pain and answer questions and concerns. Mom verbalized no concerns and confirmed understanding of newborn screenings and assessments RN to perform during shift.

## 2017-01-14 NOTE — Lactation Note (Signed)
This note was copied from a baby's chart. Lactation Consultation Note  Patient Name: Girl Bufford LopeValeria Sanchez-Villegas ZOXWR'UToday's Date: 01/14/2017 Reason for consult: Follow-up assessment (Virda - Spanish interpreter present )  Baby is 5521 hours old and and has been exclusively breast feeding . - see doc flow sheets .  Mom denies sore ness and feels breast feeding is going well.  LC updated doc flow sheets and reviewed.      Maternal Data    Feeding Feeding Type: Breast Fed Length of feed: 25 min (per mom noted swallows )  LATCH Score                   Interventions    Lactation Tools Discussed/Used     Consult Status      Matilde SprangMargaret Ann Elaria Osias 01/14/2017, 3:57 PM

## 2017-01-15 MED ORDER — IBUPROFEN 600 MG PO TABS: 600.0000 mg | ORAL_TABLET | Freq: Four times a day (QID) | ORAL | 0 refills | Status: AC

## 2017-01-16 ENCOUNTER — Ambulatory Visit: Payer: Self-pay

## 2017-01-16 NOTE — Lactation Note (Signed)
This note was copied from a baby's chart. Lactation Consultation Note  Spanish interpreter used [phone]#[id number]. P1, Baby 63 hours and sleeping after having recently breastfed. Reviewed hand expression and hand pump use.  Drops expressed from L side. Mother has positional stripe on L nipple.  Discussed increasing depth and applying ebm to nipple for soreness. Mom encouraged to feed baby 8-12 times/24 hours and with feeding cues.  Reviewed engorgement care and monitoring voids/stools. Discussed supply and demand.    Patient Name: Alyssa Oconnell [id number]'JToday's Date: 01/16/2017 Reason for consult: Follow-up assessment   Maternal Data    Feeding Feeding Type: Breast Fed Length of feed: 20 min  LATCH Score                   Interventions    Lactation Tools Discussed/Used     Consult Status Consult Status: Follow-up Date: 01/17/17 Follow-up type: In-patient    Dahlia ByesBerkelhammer, Ruth [facility] 01/16/2017, 10:10 AM

## 2017-01-17 NOTE — Discharge Summary (Signed)
OB Discharge Summary     Patient Name: Alyssa Oconnell DOB: [birth year] MRN: [id number]  Date of admission: 01/13/2017 Delivering MD: Pincus LargePHELPS, JAZMA Y   Date of discharge: 01/17/2017  Admitting diagnosis: 40wks water broke contractions  Intrauterine pregnancy: 4281w2d     Secondary diagnosis:  Active Problems:   Normal labor  Additional problems: GBS pos     Discharge diagnosis: Term Pregnancy Delivered                                                                                                Post partum procedures:none  Augmentation: Pitocin  Complications: None  Hospital course:  Onset of Labor With Vaginal Delivery     31 y.o. yo G1P1001 at 6081w2d was admitted in Latent Labor on 01/13/2017. Patient had an uncomplicated labor course as follows:  Membrane Rupture Time/Date: 3:15 AM ,01/13/2017   Intrapartum Procedures: Episiotomy: None [1]                                         Lacerations:  Labial [10]  Patient had a delivery of a Viable infant. 01/13/2017  Information for the patient's newborn:  Alyssa Oconnell, Girl Alyssa Oconnell [[id number]][[id number]]       [name] had an uncomplicated postpartum course.  She is ambulating, tolerating a regular diet, passing flatus, and urinating well. Patient is discharged home in stable condition on 01/17/17.   Physical exam  Vitals:   01/13/17 2115 01/14/17 0115 01/14/17 1850 01/15/17 0544  BP: 107/62 117/81 105/83 100/68  Pulse: 71 77 82 68  Resp: 18 18 18 18   Temp: 98.7 F (37.1 C) 98.4 F (36.9 C) 98.5 F (36.9 C) 97.7 F (36.5 C)  TempSrc: Oral Oral Oral Oral  SpO2: 96% 97%    Weight:      Height:       General: alert and cooperative Lochia: appropriate Uterine Fundus: firm Incision: N/A DVT Evaluation: No evidence of DVT seen on physical exam. Labs: Lab Results  Component Value Date   WBC 9.7 01/13/2017   HGB 13.2 01/13/2017   HCT 38.9 01/13/2017   MCV 88.2 01/13/2017   PLT 220 01/13/2017   No flowsheet data  found.  Discharge instruction: per After Visit Summary and "Baby and Me Booklet".  After visit meds:  Allergies as of 01/15/2017   No Known Allergies     Medication List    TAKE these medications   ibuprofen 600 MG tablet Commonly known as:  ADVIL,MOTRIN Take 1 tablet (600 mg total) by mouth every 6 (six) hours.   prenatal multivitamin Tabs tablet Take 1 tablet by mouth daily.       Diet: routine diet  Activity: Advance as tolerated. Pelvic rest for 6 weeks.   Outpatient follow up:4 weeks Follow up Appt:No future appointments. Follow up Visit:No Follow-up on file.  Postpartum contraception: Nexplanon  Newborn Data: Live born female  Birth Weight: 7 lb 9.2 oz (3435 g) APGAR: 9, 9  Baby Feeding: Breast Disposition:home with mother  01/17/2017 Cam HaiSHAW, Alyssa Oconnell, CNM
